# Patient Record
Sex: Female | Born: 2000 | Race: Black or African American | Hispanic: No | Marital: Single | Smoking: Never smoker
Health system: Southern US, Community
[De-identification: ages and names within clinical notes are randomized; demographics above are authoritative.]

## PROBLEM LIST (undated history)

## (undated) DIAGNOSIS — F988 Other specified behavioral and emotional disorders with onset usually occurring in childhood and adolescence: Secondary | ICD-10-CM

---
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
## 2021-10-19 ENCOUNTER — Ambulatory Visit
Admission: RE | Admit: 2021-10-19 | Discharge: 2021-10-19 | Disposition: A | Discharge status: Home / Self Care | Payer: BC Managed Care – PPO | Source: Ambulatory Visit | Attending: Emergency Medicine | Admitting: Emergency Medicine

## 2021-10-19 VITALS — BP 130/87 | HR 98 | Temp 98.7°F | Resp 16

## 2021-10-19 DIAGNOSIS — J069 Acute upper respiratory infection, unspecified: Secondary | ICD-10-CM | POA: Diagnosis not present

## 2021-10-19 MED ORDER — IPRATROPIUM BROMIDE 0.06 % NA SOLN: 2.0000 | Freq: Four times a day (QID) | NASAL | 12 refills | Status: AC

## 2021-10-19 MED ORDER — BENZONATATE 100 MG PO CAPS: 200.0000 mg | ORAL_CAPSULE | Freq: Three times a day (TID) | ORAL | 0 refills | Status: AC

## 2021-10-19 MED ORDER — PROMETHAZINE-DM 6.25-15 MG/5ML PO SYRP: 5.0000 mL | ORAL_SOLUTION | Freq: Four times a day (QID) | ORAL | 0 refills | Status: AC | PRN

## 2021-10-19 NOTE — Discharge Instructions (Signed)

## 2021-10-19 NOTE — ED Provider Notes (Signed)
[facility]    CSN: [id number] Arrival date & time: 10/19/21  1500      History   Chief Complaint Chief Complaint  Patient presents with   Cough    Sick x 8 days negative covid test had a virtual visit, but not better. - Entered by patient    HPI Cheryl Hunt is a 21 y.o. female.   HPI  21 year old female here for evaluation of respiratory complaints.  Patient reports that for the last week she has been experiencing a nonproductive cough, chest congestion, runny nose, and states that she had a fever at 2 separate points.  Last episode of fever was 2 days ago and was 101.  She has not had a fever since.  She denies any ear pain, sore throat, shortness of breath or wheezing, or GI complaints.  No known sick contacts.  Past Medical History:  Diagnosis Date   ADD (attention deficit disorder)     There are no problems to display for this patient.   History reviewed. No pertinent surgical history.  OB History   No obstetric history on file.      Home Medications    Prior to Admission medications   Medication Sig Start Date End Date Taking? Authorizing Provider  benzonatate (TESSALON) 100 MG capsule Take 2 capsules (200 mg total) by mouth every 8 (eight) hours. 10/19/21  Yes Becky Augusta, NP  ipratropium (ATROVENT) 0.06 % nasal spray Place 2 sprays into both nostrils 4 (four) times daily. 10/19/21  Yes Becky Augusta, NP  promethazine-dextromethorphan (PROMETHAZINE-DM) 6.25-15 MG/5ML syrup Take 5 mLs by mouth 4 (four) times daily as needed. 10/19/21  Yes Becky Augusta, NP    Family History No family history on file.  Social History Social History   Tobacco Use   Smoking status: Never   Smokeless tobacco: Never  Vaping Use   Vaping Use: Never used  Substance Use Topics   Alcohol use: No   Drug use: No     Allergies   Patient has no known allergies.   Review of Systems Review of Systems  Constitutional:  Positive for fever.  HENT:   Positive for congestion and rhinorrhea. Negative for ear pain and sore throat.   Respiratory:  Positive for cough. Negative for shortness of breath and wheezing.   Gastrointestinal:  Negative for diarrhea, nausea and vomiting.  Hematological: Negative.   Psychiatric/Behavioral: Negative.       Physical Exam Triage Vital Signs ED Triage Vitals  Enc Vitals Group     BP 10/19/21 1513 130/87     Pulse Rate 10/19/21 1513 98     Resp 10/19/21 1513 16     Temp 10/19/21 1513 98.7 F (37.1 C)     Temp Source 10/19/21 1513 Oral     SpO2 10/19/21 1513 99 %     Weight --      Height --      Head Circumference --      Peak Flow --      Pain Score 10/19/21 1512 5     Pain Loc --      Pain Edu? --      Excl. in GC? --    No data found.  Updated Vital Signs BP 130/87 (BP Location: Left Arm)   Pulse 98   Temp 98.7 F (37.1 C) (Oral)   Resp 16   LMP  (LMP Unknown)   SpO2 99%   Visual Acuity Right Eye Distance:  Left Eye Distance:   Bilateral Distance:    Right Eye Near:   Left Eye Near:    Bilateral Near:     Physical Exam Vitals and nursing note reviewed.  Constitutional:      Appearance: Normal appearance. She is not ill-appearing.  HENT:     Head: Normocephalic and atraumatic.     Right Ear: Tympanic membrane, ear canal and external ear normal. There is no impacted cerumen.     Left Ear: Tympanic membrane, ear canal and external ear normal. There is no impacted cerumen.     Nose: Congestion and rhinorrhea present.     Mouth/Throat:     Mouth: Mucous membranes are moist.     Pharynx: Oropharynx is clear. Posterior oropharyngeal erythema present. No oropharyngeal exudate.  Cardiovascular:     Rate and Rhythm: Normal rate and regular rhythm.     Pulses: Normal pulses.     Heart sounds: Normal heart sounds. No murmur heard.    No friction rub. No gallop.  Pulmonary:     Effort: Pulmonary effort is normal.     Breath sounds: Normal breath sounds. No wheezing, rhonchi  or rales.  Musculoskeletal:     Cervical back: Normal range of motion and neck supple.  Lymphadenopathy:     Cervical: No cervical adenopathy.  Skin:    General: Skin is warm and dry.     Capillary Refill: Capillary refill takes less than 2 seconds.     Findings: No erythema or rash.  Neurological:     General: No focal deficit present.     Mental Status: She is alert and oriented to person, place, and time.  Psychiatric:        Mood and Affect: Mood normal.        Behavior: Behavior normal.        Thought Content: Thought content normal.        Judgment: Judgment normal.      UC Treatments / Results  Labs (all labs ordered are listed, but only abnormal results are displayed) Labs Reviewed - No data to display  EKG   Radiology No results found.  Procedures Procedures (including critical care time)  Medications Ordered in UC Medications - No data to display  Initial Impression / Assessment and Plan / UC Course  I have reviewed the triage vital signs and the nursing notes.  Pertinent labs & imaging results that were available during my care of the patient were reviewed by me and considered in my medical decision making (see chart for details).   Patient is a very pleasant, nontoxic-appearing 21 year old female here for evaluation Rester complaints outlined in HPI above.  Her physical exam reveals pearly-gray tympanic membranes bilaterally with normal light reflex and clear external auditory canals.  Nasal mucosa is erythematous and edematous with clear discharge in both nares.  Oropharyngeal exam reveals posterior oropharyngeal erythema with clear postnasal drip.  Tonsillar pillars are benign.  No cervical lymphadenopathy appreciable exam.  Cardiopulmonary exam reveals S1-S2 heart sounds with regular rate and rhythm and lung sounds are clear to auscultation all fields.  Patient exam is consistent with a viral URI with a cough.  She is outside the window for treatment or  quarantine for COVID so I do not feel is necessary to test her at this time.  She did take a home COVID test which was negative.  I will treat her symptoms with Atrovent nasal spray, Tessalon Perles, and Promethazine DM cough syrup.  Work  note provided.   Final Clinical Impressions(s) / UC Diagnoses   Final diagnoses:  Viral URI with cough     Discharge Instructions      Use the Atrovent nasal spray, 2 squirts in each nostril every 6 hours, as needed for runny nose and postnasal drip.  Use the Tessalon Perles every 8 hours during the day.  Take them with a small sip of water.  They may give you some numbness to the base of your tongue or a metallic taste in your mouth, this is normal.  Use the Promethazine DM cough syrup at bedtime for cough and congestion.  It will make you drowsy so do not take it during the day.  Return for reevaluation or see your primary care provider for any new or worsening symptoms.      ED Prescriptions     Medication Sig Dispense Auth. Provider   benzonatate (TESSALON) 100 MG capsule Take 2 capsules (200 mg total) by mouth every 8 (eight) hours. 21 capsule Becky Augusta, NP   ipratropium (ATROVENT) 0.06 % nasal spray Place 2 sprays into both nostrils 4 (four) times daily. 15 mL Becky Augusta, NP   promethazine-dextromethorphan (PROMETHAZINE-DM) 6.25-15 MG/5ML syrup Take 5 mLs by mouth 4 (four) times daily as needed. 118 mL Becky Augusta, NP      PDMP not reviewed this encounter.   Becky Augusta, NP 10/19/21 1529

## 2021-10-19 NOTE — ED Triage Notes (Addendum)
Pt c/o cough and chest congestion x 1 week. At home Covid test was negative.
